# Patient Record
Sex: Female | Born: 1990 | Hispanic: Yes | Marital: Married | Smoking: Never smoker
Health system: Southern US, Community
[De-identification: ages and names within clinical notes are randomized; demographics above are authoritative.]

## PROBLEM LIST (undated history)

## (undated) DIAGNOSIS — D649 Anemia, unspecified: Secondary | ICD-10-CM

## (undated) HISTORY — DX: Anemia, unspecified: D64.9

---

## 2018-01-17 DIAGNOSIS — N7689 Other specified inflammation of vagina and vulva: Secondary | ICD-10-CM | POA: Diagnosis not present

## 2018-01-26 DIAGNOSIS — N760 Acute vaginitis: Secondary | ICD-10-CM | POA: Diagnosis not present

## 2019-02-17 DIAGNOSIS — D225 Melanocytic nevi of trunk: Secondary | ICD-10-CM | POA: Diagnosis not present

## 2019-02-17 DIAGNOSIS — D485 Neoplasm of uncertain behavior of skin: Secondary | ICD-10-CM | POA: Diagnosis not present

## 2019-03-31 DIAGNOSIS — D225 Melanocytic nevi of trunk: Secondary | ICD-10-CM | POA: Diagnosis not present

## 2019-03-31 DIAGNOSIS — D2239 Melanocytic nevi of other parts of face: Secondary | ICD-10-CM | POA: Diagnosis not present

## 2019-03-31 DIAGNOSIS — D485 Neoplasm of uncertain behavior of skin: Secondary | ICD-10-CM | POA: Diagnosis not present

## 2019-03-31 DIAGNOSIS — Z872 Personal history of diseases of the skin and subcutaneous tissue: Secondary | ICD-10-CM | POA: Diagnosis not present

## 2019-05-11 DIAGNOSIS — D225 Melanocytic nevi of trunk: Secondary | ICD-10-CM | POA: Diagnosis not present

## 2019-05-11 DIAGNOSIS — D485 Neoplasm of uncertain behavior of skin: Secondary | ICD-10-CM | POA: Diagnosis not present

## 2019-08-10 DIAGNOSIS — L989 Disorder of the skin and subcutaneous tissue, unspecified: Secondary | ICD-10-CM | POA: Diagnosis not present

## 2019-08-10 DIAGNOSIS — D485 Neoplasm of uncertain behavior of skin: Secondary | ICD-10-CM | POA: Diagnosis not present

## 2019-08-24 DIAGNOSIS — M5416 Radiculopathy, lumbar region: Secondary | ICD-10-CM | POA: Diagnosis not present

## 2019-08-24 DIAGNOSIS — S39012A Strain of muscle, fascia and tendon of lower back, initial encounter: Secondary | ICD-10-CM | POA: Diagnosis not present

## 2019-08-24 DIAGNOSIS — M545 Low back pain: Secondary | ICD-10-CM | POA: Diagnosis not present

## 2019-08-31 DIAGNOSIS — M5416 Radiculopathy, lumbar region: Secondary | ICD-10-CM | POA: Diagnosis not present

## 2019-11-15 DIAGNOSIS — Z8739 Personal history of other diseases of the musculoskeletal system and connective tissue: Secondary | ICD-10-CM | POA: Diagnosis not present

## 2019-11-15 DIAGNOSIS — R0789 Other chest pain: Secondary | ICD-10-CM | POA: Diagnosis not present

## 2019-11-23 DIAGNOSIS — F411 Generalized anxiety disorder: Secondary | ICD-10-CM | POA: Diagnosis not present

## 2019-11-23 DIAGNOSIS — Z0000 Encounter for general adult medical examination without abnormal findings: Secondary | ICD-10-CM | POA: Diagnosis not present

## 2019-11-23 DIAGNOSIS — Z862 Personal history of diseases of the blood and blood-forming organs and certain disorders involving the immune mechanism: Secondary | ICD-10-CM | POA: Diagnosis not present

## 2019-11-23 DIAGNOSIS — Z13220 Encounter for screening for lipoid disorders: Secondary | ICD-10-CM | POA: Diagnosis not present

## 2019-12-02 DIAGNOSIS — H60391 Other infective otitis externa, right ear: Secondary | ICD-10-CM | POA: Diagnosis not present

## 2019-12-15 DIAGNOSIS — Z6822 Body mass index (BMI) 22.0-22.9, adult: Secondary | ICD-10-CM | POA: Diagnosis not present

## 2019-12-15 DIAGNOSIS — Z01419 Encounter for gynecological examination (general) (routine) without abnormal findings: Secondary | ICD-10-CM | POA: Diagnosis not present

## 2020-02-21 DIAGNOSIS — Z20822 Contact with and (suspected) exposure to covid-19: Secondary | ICD-10-CM | POA: Diagnosis not present

## 2020-02-26 DIAGNOSIS — J40 Bronchitis, not specified as acute or chronic: Secondary | ICD-10-CM | POA: Diagnosis not present

## 2020-02-26 DIAGNOSIS — J329 Chronic sinusitis, unspecified: Secondary | ICD-10-CM | POA: Diagnosis not present

## 2020-04-30 DIAGNOSIS — M791 Myalgia, unspecified site: Secondary | ICD-10-CM | POA: Diagnosis not present

## 2020-04-30 DIAGNOSIS — Z03818 Encounter for observation for suspected exposure to other biological agents ruled out: Secondary | ICD-10-CM | POA: Diagnosis not present

## 2020-04-30 DIAGNOSIS — R509 Fever, unspecified: Secondary | ICD-10-CM | POA: Diagnosis not present

## 2020-04-30 DIAGNOSIS — R1110 Vomiting, unspecified: Secondary | ICD-10-CM | POA: Diagnosis not present

## 2020-05-13 DIAGNOSIS — B9789 Other viral agents as the cause of diseases classified elsewhere: Secondary | ICD-10-CM | POA: Diagnosis not present

## 2020-05-13 DIAGNOSIS — J040 Acute laryngitis: Secondary | ICD-10-CM | POA: Diagnosis not present

## 2020-05-13 DIAGNOSIS — J329 Chronic sinusitis, unspecified: Secondary | ICD-10-CM | POA: Diagnosis not present

## 2020-07-10 DIAGNOSIS — L905 Scar conditions and fibrosis of skin: Secondary | ICD-10-CM | POA: Diagnosis not present

## 2020-07-10 DIAGNOSIS — D485 Neoplasm of uncertain behavior of skin: Secondary | ICD-10-CM | POA: Diagnosis not present

## 2020-07-10 DIAGNOSIS — D229 Melanocytic nevi, unspecified: Secondary | ICD-10-CM | POA: Diagnosis not present

## 2020-07-10 DIAGNOSIS — D225 Melanocytic nevi of trunk: Secondary | ICD-10-CM | POA: Diagnosis not present

## 2020-07-10 DIAGNOSIS — L989 Disorder of the skin and subcutaneous tissue, unspecified: Secondary | ICD-10-CM | POA: Diagnosis not present

## 2020-07-10 DIAGNOSIS — L218 Other seborrheic dermatitis: Secondary | ICD-10-CM | POA: Diagnosis not present

## 2021-01-09 DIAGNOSIS — L814 Other melanin hyperpigmentation: Secondary | ICD-10-CM | POA: Diagnosis not present

## 2021-01-09 DIAGNOSIS — D225 Melanocytic nevi of trunk: Secondary | ICD-10-CM | POA: Diagnosis not present

## 2021-01-09 DIAGNOSIS — D2271 Melanocytic nevi of right lower limb, including hip: Secondary | ICD-10-CM | POA: Diagnosis not present

## 2021-01-09 DIAGNOSIS — L821 Other seborrheic keratosis: Secondary | ICD-10-CM | POA: Diagnosis not present

## 2021-01-09 DIAGNOSIS — L905 Scar conditions and fibrosis of skin: Secondary | ICD-10-CM | POA: Diagnosis not present

## 2021-01-27 DIAGNOSIS — N898 Other specified noninflammatory disorders of vagina: Secondary | ICD-10-CM | POA: Diagnosis not present

## 2021-01-27 DIAGNOSIS — R300 Dysuria: Secondary | ICD-10-CM | POA: Diagnosis not present

## 2021-01-27 DIAGNOSIS — D219 Benign neoplasm of connective and other soft tissue, unspecified: Secondary | ICD-10-CM | POA: Diagnosis not present

## 2021-04-02 DIAGNOSIS — Z113 Encounter for screening for infections with a predominantly sexual mode of transmission: Secondary | ICD-10-CM | POA: Diagnosis not present

## 2021-04-02 DIAGNOSIS — N898 Other specified noninflammatory disorders of vagina: Secondary | ICD-10-CM | POA: Diagnosis not present

## 2021-04-24 DIAGNOSIS — Z124 Encounter for screening for malignant neoplasm of cervix: Secondary | ICD-10-CM | POA: Diagnosis not present

## 2021-04-24 DIAGNOSIS — Z0000 Encounter for general adult medical examination without abnormal findings: Secondary | ICD-10-CM | POA: Diagnosis not present

## 2021-04-26 ENCOUNTER — Other Ambulatory Visit: Payer: Self-pay | Admitting: Home Modifications

## 2021-04-26 DIAGNOSIS — R922 Inconclusive mammogram: Secondary | ICD-10-CM

## 2021-04-26 DIAGNOSIS — Z862 Personal history of diseases of the blood and blood-forming organs and certain disorders involving the immune mechanism: Secondary | ICD-10-CM | POA: Diagnosis not present

## 2021-04-26 DIAGNOSIS — Z803 Family history of malignant neoplasm of breast: Secondary | ICD-10-CM

## 2021-04-26 DIAGNOSIS — Z13220 Encounter for screening for lipoid disorders: Secondary | ICD-10-CM | POA: Diagnosis not present

## 2021-06-01 DIAGNOSIS — H6503 Acute serous otitis media, bilateral: Secondary | ICD-10-CM | POA: Diagnosis not present

## 2021-06-01 DIAGNOSIS — J209 Acute bronchitis, unspecified: Secondary | ICD-10-CM | POA: Diagnosis not present

## 2021-06-01 DIAGNOSIS — R051 Acute cough: Secondary | ICD-10-CM | POA: Diagnosis not present

## 2021-06-14 ENCOUNTER — Ambulatory Visit: Payer: Self-pay

## 2021-06-14 ENCOUNTER — Ambulatory Visit
Admission: RE | Admit: 2021-06-14 | Discharge: 2021-06-14 | Disposition: A | Discharge status: Home / Self Care | Payer: BC Managed Care – PPO | Source: Ambulatory Visit | Attending: Home Modifications | Admitting: Home Modifications

## 2021-06-14 DIAGNOSIS — R922 Inconclusive mammogram: Secondary | ICD-10-CM | POA: Diagnosis not present

## 2021-06-14 DIAGNOSIS — Z803 Family history of malignant neoplasm of breast: Secondary | ICD-10-CM

## 2021-09-19 ENCOUNTER — Other Ambulatory Visit: Payer: Self-pay | Admitting: Family Medicine

## 2021-09-19 DIAGNOSIS — N6311 Unspecified lump in the right breast, upper outer quadrant: Secondary | ICD-10-CM

## 2021-09-19 DIAGNOSIS — N644 Mastodynia: Secondary | ICD-10-CM

## 2021-10-02 ENCOUNTER — Ambulatory Visit
Admission: RE | Admit: 2021-10-02 | Discharge: 2021-10-02 | Disposition: A | Discharge status: Home / Self Care | Payer: BC Managed Care – PPO | Source: Ambulatory Visit | Attending: Family Medicine | Admitting: Family Medicine

## 2021-10-02 ENCOUNTER — Ambulatory Visit
Admission: RE | Admit: 2021-10-02 | Discharge: 2021-10-02 | Disposition: A | Discharge status: Home / Self Care | Payer: BLUE CROSS/BLUE SHIELD | Source: Ambulatory Visit | Attending: Family Medicine | Admitting: Family Medicine

## 2021-10-02 DIAGNOSIS — N644 Mastodynia: Secondary | ICD-10-CM

## 2021-10-02 DIAGNOSIS — N6311 Unspecified lump in the right breast, upper outer quadrant: Secondary | ICD-10-CM

## 2022-05-02 ENCOUNTER — Ambulatory Visit (INDEPENDENT_AMBULATORY_CARE_PROVIDER_SITE_OTHER): Payer: BLUE CROSS/BLUE SHIELD | Admitting: Neurology

## 2022-05-02 ENCOUNTER — Encounter: Payer: Self-pay | Admitting: Neurology

## 2022-05-02 VITALS — BP 101/68 | HR 76 | Ht 68.0 in | Wt 143.0 lb

## 2022-05-02 DIAGNOSIS — R569 Unspecified convulsions: Secondary | ICD-10-CM

## 2022-05-02 DIAGNOSIS — G43009 Migraine without aura, not intractable, without status migrainosus: Secondary | ICD-10-CM

## 2022-05-02 MED ORDER — AMITRIPTYLINE HCL 25 MG PO TABS: 25.0000 mg | ORAL_TABLET | Freq: Every day | ORAL | 0 refills | Status: DC

## 2022-05-02 NOTE — Progress Notes (Signed)
[facility]  PATIENT: Vanessa Ortega DOB: Jun 24, 1990  REQUESTING CLINICIAN: Jarrett Soho, PA-C HISTORY FROM: Patient and husband  REASON FOR VISIT: Seizure vs. Syncope/Headaches    HISTORICAL  CHIEF COMPLAINT:  Chief Complaint  Patient presents with   New Patient (Initial Visit)    Rm 12, pt with husband, she states on Sunday she was getting out of the car and while she was waiting for her son to come around the car. She states that she developed a sensation on top right head and then next thing she knew her husband was in front of her trying to get her attention.    Other    He witnessed her say "she felt like she was going to faint" and then she went unresponsive, eyes were open but she was not responding and then her right  hand began to shake. She leaned back and slid down the car and husband caught her. During the daytime leading up to the event she was tired and not feeling 100%. She had postictal state and was drowsy. She took a nap.  Denies hx of seizures and no family hx      HISTORY OF PRESENT ILLNESS:  This is a 31 year old woman past medical history of ADHD, asthma, seasonal allergy who is presenting for seizure versus syncope.  Patient reports last Sunday after having lunch around 1 PM, they went downtown, in the evening around 5 or 6 PM, she got out of her car, was waiting for her son then remember felling numbness, like a void on top of head, next thing she woke up to her husband calling her name.  Husband said that patient raise her right hand and was shaking it, eyes were wide open, initially she told him that she felt dizzy and she was going to pass out.  With the episode she did not respond for about 10 seconds, she reports feeling tired afterward but there was no post ictal confusion.  She reports felling tingling in hands and feet, ringing in hear, feeling cold and hot after the event, then took an hour nap. Denies any previous  history of seizure or syncope and no seizure risk factors.   Patient also has been complaining of headaches, she described them as right-sided headache, aching pain with light sensitivity and noise sensitivity but no nausea, no vomiting.  With the headache she takes Aleve or Motrin within 30 minutes to an hour the headaches subside.  She has never been on a preventive medication.  OTHER MEDICAL CONDITIONS: Seasonal allergies, asthma, ADHD    REVIEW OF SYSTEMS: Full 14 system review of systems performed and negative with exception of: As noted in the HPI   ALLERGIES: Allergies  Allergen Reactions   Pineapple Hives, Itching and Rash   Iodine Rash   Shellfish-Derived Products Hives and Itching    HOME MEDICATIONS: Outpatient Medications Prior to Visit  Medication Sig Dispense Refill   albuterol (VENTOLIN HFA) 108 (90 Base) MCG/ACT inhaler Inhale 2 puffs into the lungs every 4 (four) hours as needed.     amphetamine-dextroamphetamine (ADDERALL XR) 10 MG 24 hr capsule Take 10 mg by mouth 2 (two) times daily.     benzonatate (TESSALON) 200 MG capsule Take 200 mg by mouth 3 (three) times daily as needed.     fluticasone (FLONASE) 50 MCG/ACT nasal spray Place into both nostrils.     Multiple Vitamin (MULTIVITAMIN) tablet Take 1 tablet by mouth daily.     No facility-administered  medications prior to visit.    PAST MEDICAL HISTORY: Past Medical History:  Diagnosis Date   Anemia     PAST SURGICAL HISTORY: History reviewed. No pertinent surgical history.  FAMILY HISTORY: Family History  Problem Relation Age of Onset   Diabetes Mother    Hypertension Mother    Diabetes Father    Breast cancer Maternal Aunt    Breast cancer Maternal Aunt    Hypertension Maternal Grandmother    Diabetes Maternal Grandmother    Diabetes Maternal Grandfather    Hypertension Paternal Grandmother    Diabetes Paternal Grandmother    Diabetes Paternal Grandfather     SOCIAL HISTORY: Social History    Socioeconomic History   Marital status: Married    Spouse name: Not on file   Number of children: Not on file   Years of education: Not on file   Highest education level: Not on file  Occupational History   Not on file  Tobacco Use   Smoking status: Never   Smokeless tobacco: Never  Vaping Use   Vaping Use: Never used  Substance and Sexual Activity   Alcohol use: Not Currently   Drug use: Not Currently   Sexual activity: Not on file  Other Topics Concern   Not on file  Social History Narrative   Not on file   Social Determinants of Health   Financial Resource Strain: Not on file  Food Insecurity: Not on file  Transportation Needs: Not on file  Physical Activity: Not on file  Stress: Not on file  Social Connections: Not on file  Intimate Partner Violence: Not on file   PHYSICAL EXAM   GENERAL EXAM/CONSTITUTIONAL: Vitals:  Vitals:   05/02/22 1029  BP: 101/68  Pulse: 76  Weight: 143 lb (64.9 kg)  Height: 5\' 8"  (1.727 m)   Body mass index is 21.74 kg/m. Wt Readings from Last 3 Encounters:  05/02/22 143 lb (64.9 kg)   Patient is in no distress; well developed, nourished and groomed; neck is supple  EYES: Visual fields full to confrontation, Extraocular movements intacts,   MUSCULOSKELETAL: Gait, strength, tone, movements noted in Neurologic exam below  NEUROLOGIC: MENTAL STATUS:      No data to display         awake, alert, oriented to person, place and time recent and remote memory intact normal attention and concentration language fluent, comprehension intact, naming intact fund of knowledge appropriate  CRANIAL NERVE:  2nd, 3rd, 4th, 6th - Visual fields full to confrontation, extraocular muscles intact, no nystagmus 5th - facial sensation symmetric 7th - facial strength symmetric 8th - hearing intact 9th - palate elevates symmetrically, uvula midline 11th - shoulder shrug symmetric 12th - tongue protrusion midline  MOTOR:  normal bulk  and tone, full strength in the BUE, BLE  SENSORY:  normal and symmetric to light touch  COORDINATION:  finger-nose-finger, fine finger movements norma GAIT/STATION:  normal     DIAGNOSTIC DATA (LABS, IMAGING, TESTING) - I reviewed patient records, labs, notes, testing and imaging myself where available.  No results found for: "WBC", "HGB", "HCT", "MCV", "PLT" No results found for: "NA", "K", "CL", "CO2", "GLUCOSE", "BUN", "CREATININE", "CALCIUM", "PROT", "ALBUMIN", "AST", "ALT", "ALKPHOS", "BILITOT", "GFRNONAA", "GFRAA" No results found for: "CHOL", "HDL", "LDLCALC", "LDLDIRECT", "TRIG", "CHOLHDL" No results found for: "HGBA1C" No results found for: "VITAMINB12" No results found for: "TSH"    ASSESSMENT AND PLAN  31 y.o. year old female with history of ADHD, asthma, seasonal allergy who is presenting  for seizure versus syncope.  I do suspect syncope but will get an EEG to rule out seizures.  I will contact the patient to go over the result.   For her headaches, I will start her on amitriptyline 25 mg nightly and advised her to continue with Aleve and Tylenol for the headaches since it has been helpful.  She will contact me and update me on the frequency of the headaches.  I will see her in 6 months for follow-up or sooner if worse.   1. Seizure-like activity (HCC)   2. Migraine without aura and without status migrainosus, not intractable     Patient Instructions  Start with amitriptyline  1/2 tablet nightly for 2 weeks then increase to full tablet  Continue with Aleve or Tylenol as needed  Routine EEG  Follow up in 6 months    Orders Placed This Encounter  Procedures   EEG adult    Meds ordered this encounter  Medications   amitriptyline (ELAVIL) 25 MG tablet    Sig: Take 1 tablet (25 mg total) by mouth at bedtime.    Dispense:  90 tablet    Refill:  0    Return in about 6 months (around 10/31/2022).    Windell Norfolk, MD 05/02/2022, 11:07 AM  [facility] [address] [place], [place] [address] [phone]

## 2022-05-02 NOTE — Patient Instructions (Addendum)
Start with amitriptyline  1/2 tablet nightly for 2 weeks then increase to full tablet  Continue with Aleve or Tylenol as needed  Routine EEG  Follow up in 6 months

## 2022-06-05 ENCOUNTER — Ambulatory Visit (INDEPENDENT_AMBULATORY_CARE_PROVIDER_SITE_OTHER): Payer: BLUE CROSS/BLUE SHIELD | Admitting: Neurology

## 2022-06-05 DIAGNOSIS — R569 Unspecified convulsions: Secondary | ICD-10-CM

## 2022-06-05 NOTE — Procedures (Signed)
    History:  31 year old woman with seizure like activity   EEG classification: Awake and drowsy  Description of the recording: The background rhythms of this recording consists of a fairly well modulated medium amplitude alpha rhythm of 10-11 Hz that is reactive to eye opening and closure. Present in the anterior head region is a 15-20 Hz beta activity. Photic stimulation was performed, did not show any abnormalities. Hyperventilation was also performed, did not show any abnormalities. Drowsiness was manifested by background fragmentation. No abnormal epileptiform discharges seen during this recording. There was no focal slowing. There were no electrographic seizure identified.   Abnormality: None   Impression: This is a normal EEG recorded while drowsy and awake. No evidence of interictal epileptiform discharges. Normal EEGs, however, do not rule out epilepsy.    Windell Norfolk, MD [facility]

## 2022-06-05 NOTE — Progress Notes (Signed)
Please call and inform patient that her recent EEG (Brain wave test) was normal. In particular, there were no epileptiform discharges and no seizures. No further action is required on this test at this time. Please keep any upcoming appointments or tests and  call us with any interim questions, concerns, problems or updates. Thanks,   Ankita Newcomer, MD 

## 2022-06-11 ENCOUNTER — Telehealth: Payer: Self-pay

## 2022-06-11 NOTE — Telephone Encounter (Signed)
-----   Message from Windell Norfolk, MD sent at 06/05/2022  4:45 PM EST ----- Please call and inform patient that her recent  EEG (Brain wave test) was normal. In particular, there were no epileptiform discharges and no seizures. No further action is required on this test at this time. Please keep any upcoming appointments or tests and  call us with any interim questions, concerns, problems or updates. Thanks,   Windell Norfolk, MD

## 2022-06-11 NOTE — Telephone Encounter (Signed)
Pt verified by name and DOB,  normal results given per provider, pt voiced understanding all question answered. °

## 2022-07-29 ENCOUNTER — Other Ambulatory Visit: Payer: Self-pay | Admitting: Neurology

## 2022-10-20 ENCOUNTER — Encounter: Payer: Self-pay | Admitting: Neurology

## 2022-10-20 ENCOUNTER — Ambulatory Visit: Payer: BC Managed Care – PPO | Admitting: Neurology

## 2022-10-30 ENCOUNTER — Other Ambulatory Visit: Payer: Self-pay | Admitting: Physician Assistant

## 2022-10-30 DIAGNOSIS — R1902 Left upper quadrant abdominal swelling, mass and lump: Secondary | ICD-10-CM

## 2022-12-04 ENCOUNTER — Other Ambulatory Visit: Payer: Self-pay

## 2023-04-20 IMAGING — MG DIGITAL DIAGNOSTIC BILAT W/ TOMO W/ CAD
8 series · 8 of 24 positions shown · non-contrast
Comparison: None.

CLINICAL DATA: 30-year-old female presenting for evaluation due to
family history of breast cancer. She has 2 maternal aunts with
history of breast cancer diagnosed in their 40s.

EXAM:
DIGITAL DIAGNOSTIC BILATERAL MAMMOGRAM WITH TOMOSYNTHESIS AND CAD
TECHNIQUE: Bilateral digital diagnostic mammography and breast tomosynthesis
was performed. The images were evaluated with computer-aided
detection.

[R CC synth-2D]
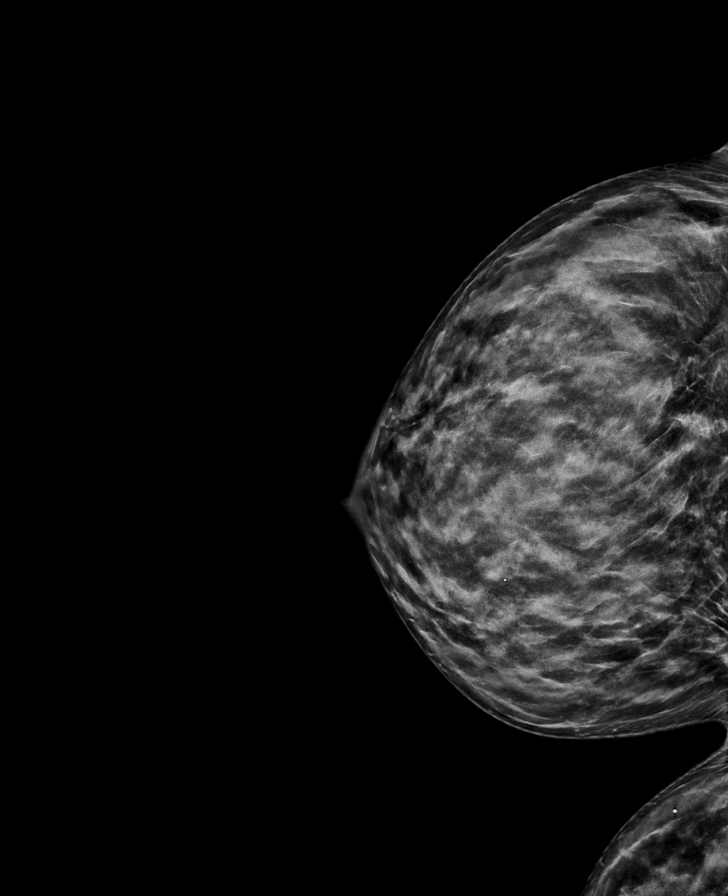

[L MLO synth-2D]
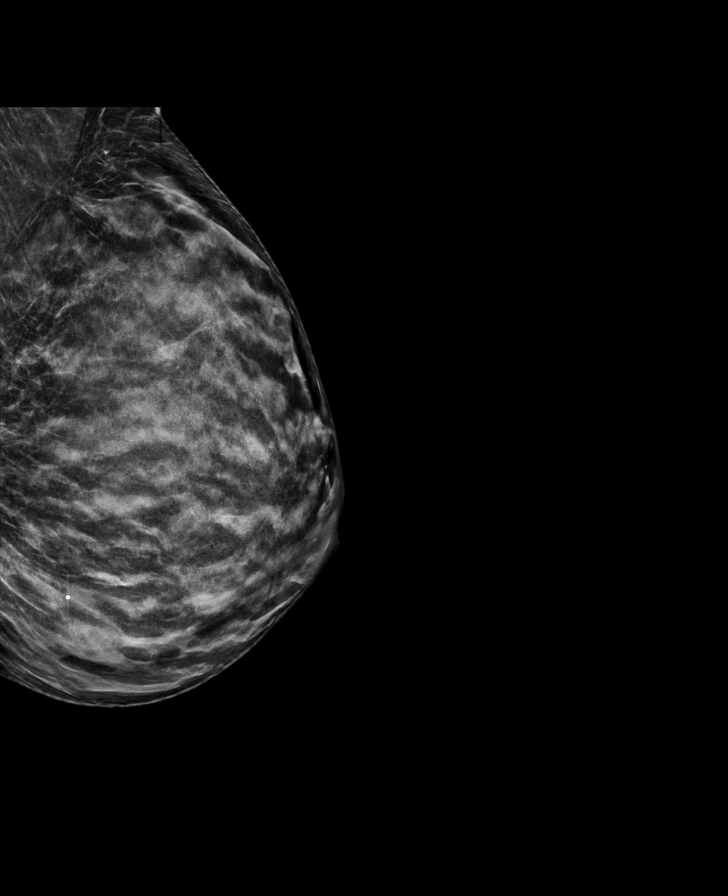

[L CC synth-2D]
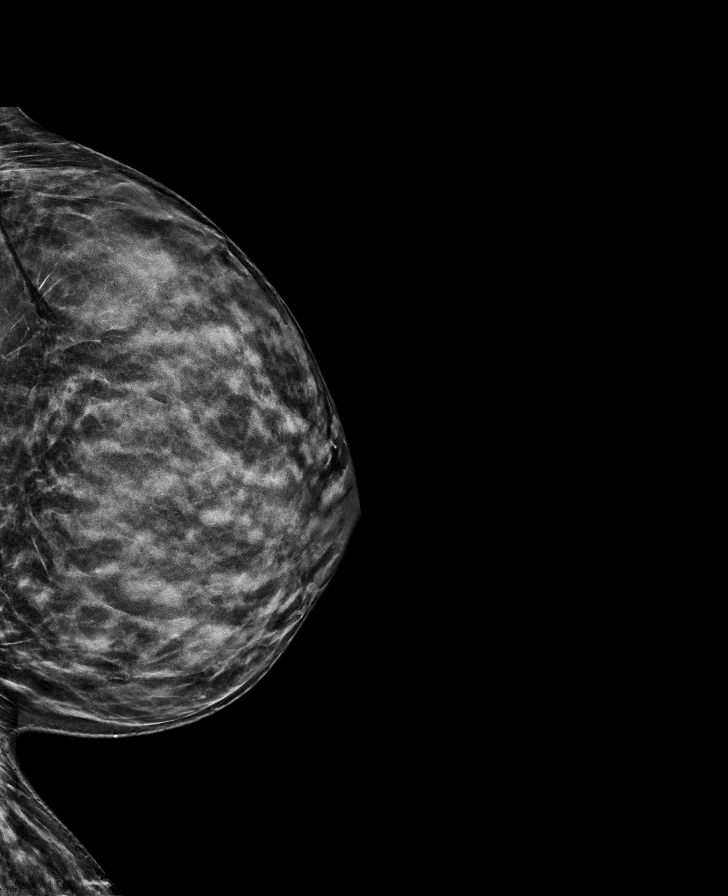

[R MLO synth-2D]
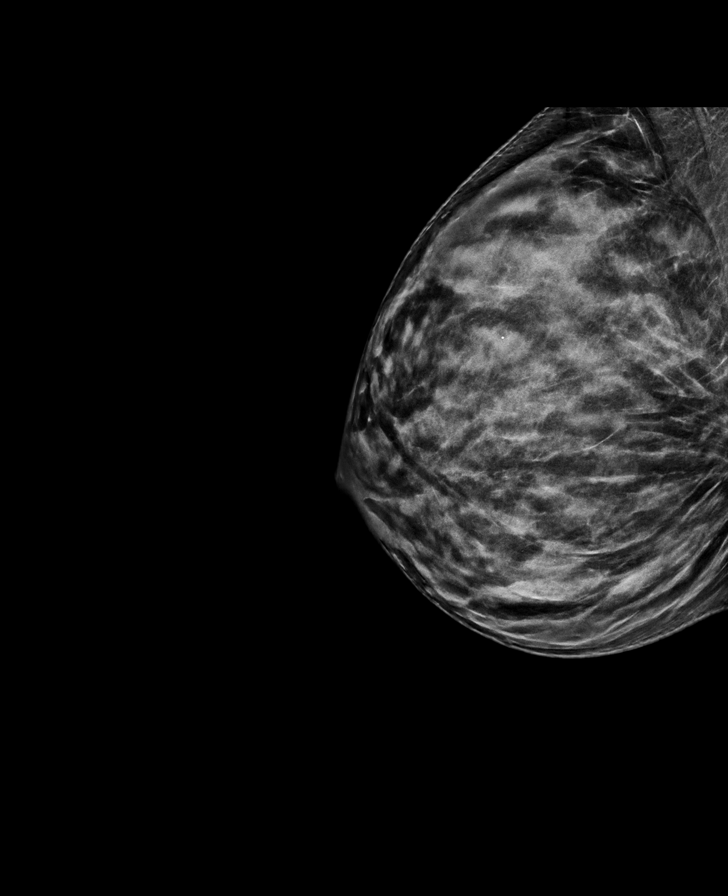

[L CC tomo · tomo slice 31/61.0]
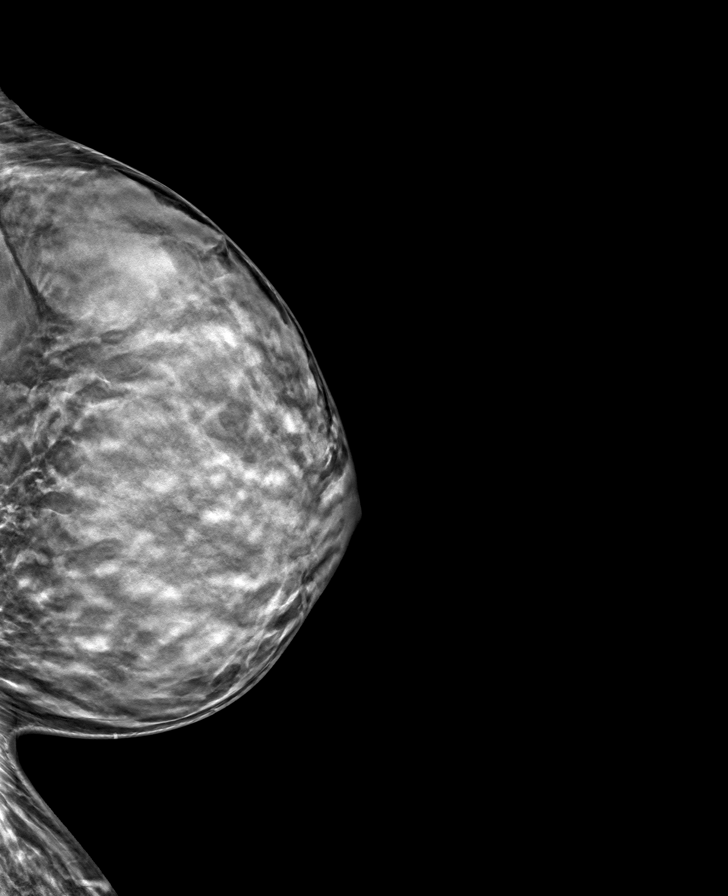

[R CC tomo · tomo slice 33/65.0]
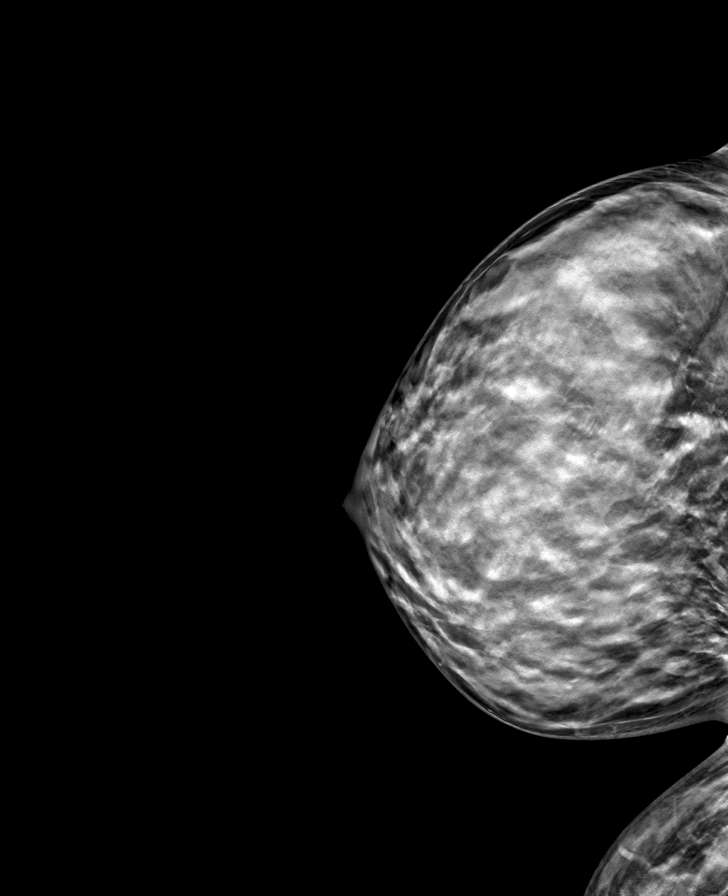

[L MLO tomo · tomo slice 31/62.0]
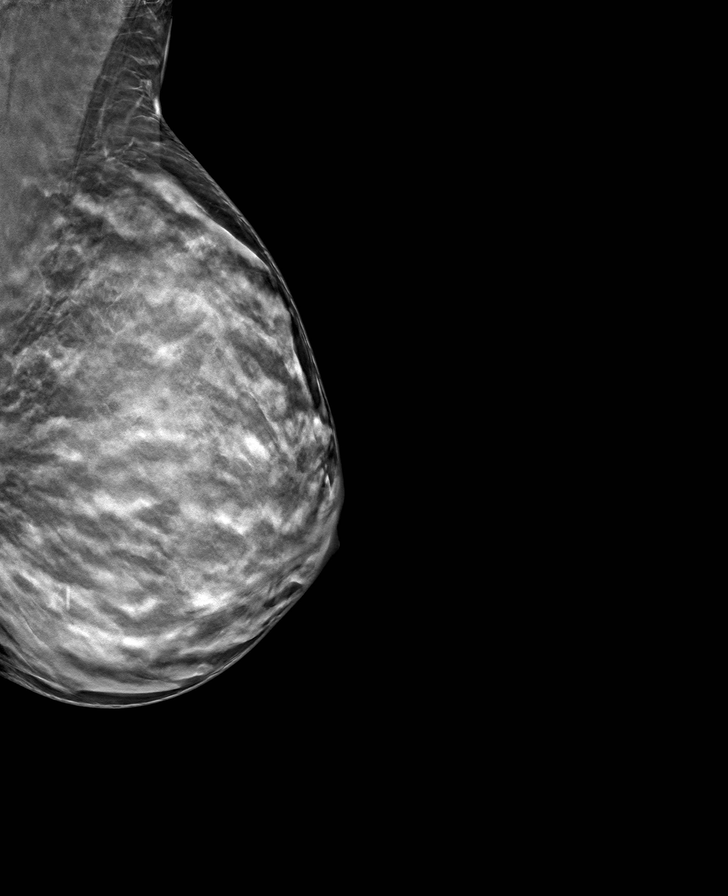

[R MLO tomo · tomo slice 33/65.0]
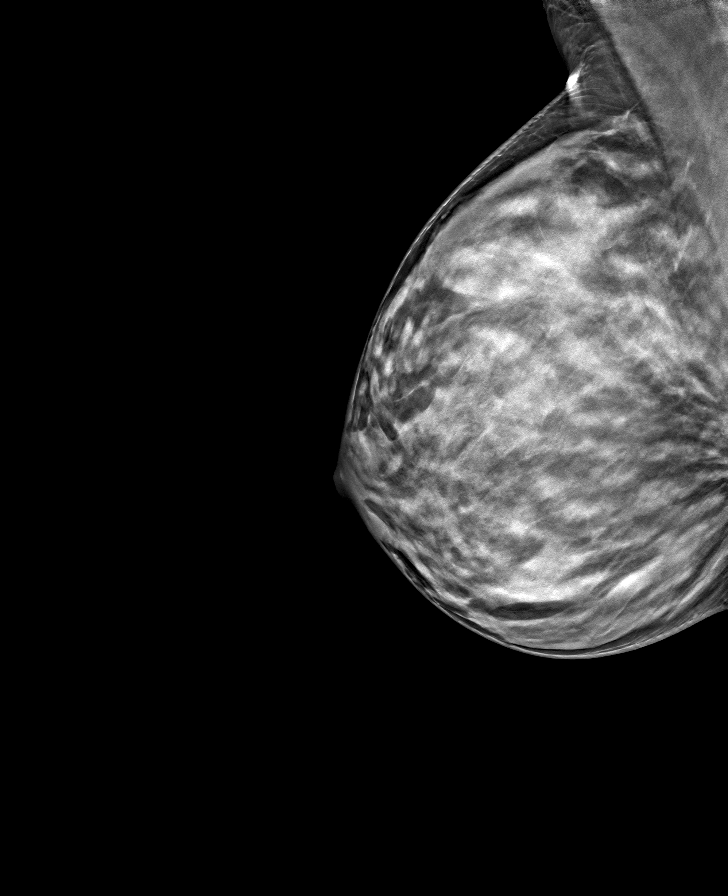

[8 of 24 positions shown; findings below may reference images not displayed]

ACR Breast Density Category d: The breast tissue is extremely dense,
which lowers the sensitivity of mammography.
FINDINGS: No suspicious calcifications, masses or areas of distortion are seen
in the bilateral breasts.
IMPRESSION: No mammographic evidence of malignancy in the bilateral breasts.

RECOMMENDATION:
1. Consider genetics assessment to determine the patient's lifetime
risk of breast cancer given her family history, if this has not
already been performed. Per [employer] guidelines, if
the patient has a calculated lifetime risk of developing breast
cancer of greater than 20%, annual screening MRI of the breasts
would be recommended at the time of screening mammography.

2. Screening mammogram at age 40 unless there are persistent or
intervening clinical concerns, unless this recommendation is
modified by the results a genetics assessment. (Code:7I-K-IOI)

I have discussed the findings and recommendations with the patient.
If applicable, a reminder letter will be sent to the patient
regarding the next appointment.

BI-RADS CATEGORY  1: Negative.
